# Patient Record
Sex: Female | Born: 1975 | Race: Asian | Hispanic: No | Marital: Married | Smoking: Never smoker
Health system: Southern US, Community
[De-identification: ages and names within clinical notes are randomized; demographics above are authoritative.]

## PROBLEM LIST (undated history)

## (undated) DIAGNOSIS — Z789 Other specified health status: Secondary | ICD-10-CM

---

## 1999-10-16 ENCOUNTER — Other Ambulatory Visit: Admission: RE | Admit: 1999-10-16 | Discharge: 1999-10-16 | Payer: Self-pay | Admitting: *Deleted

## 2000-01-06 ENCOUNTER — Encounter: Payer: Self-pay | Admitting: Obstetrics and Gynecology

## 2000-01-06 ENCOUNTER — Ambulatory Visit (HOSPITAL_COMMUNITY): Admission: RE | Admit: 2000-01-06 | Discharge: 2000-01-06 | Payer: Self-pay | Admitting: Obstetrics and Gynecology

## 2000-01-23 ENCOUNTER — Encounter: Payer: Self-pay | Admitting: Obstetrics and Gynecology

## 2000-01-23 ENCOUNTER — Inpatient Hospital Stay (HOSPITAL_COMMUNITY): Admission: AD | Admit: 2000-01-23 | Discharge: 2000-01-24 | Payer: Self-pay | Admitting: Obstetrics and Gynecology

## 2001-03-09 ENCOUNTER — Other Ambulatory Visit: Admission: RE | Admit: 2001-03-09 | Discharge: 2001-03-09 | Payer: Self-pay | Admitting: Obstetrics and Gynecology

## 2002-01-15 ENCOUNTER — Encounter: Payer: Self-pay | Admitting: *Deleted

## 2002-01-15 ENCOUNTER — Ambulatory Visit (HOSPITAL_COMMUNITY): Admission: RE | Admit: 2002-01-15 | Discharge: 2002-01-15 | Payer: Self-pay | Admitting: *Deleted

## 2003-10-11 ENCOUNTER — Other Ambulatory Visit: Admission: RE | Admit: 2003-10-11 | Discharge: 2003-10-11 | Payer: Self-pay | Admitting: Family Medicine

## 2006-04-08 ENCOUNTER — Ambulatory Visit: Payer: Self-pay | Admitting: Endocrinology

## 2006-04-08 LAB — CONVERTED CEMR LAB
Sed Rate: 21 mm/hr (ref 0–25)
TSH: 75.22 microintl units/mL — ABNORMAL HIGH (ref 0.35–5.50)

## 2006-05-19 ENCOUNTER — Ambulatory Visit: Payer: Self-pay | Admitting: Endocrinology

## 2006-05-19 LAB — CONVERTED CEMR LAB: TSH: 65.96 microintl units/mL — ABNORMAL HIGH (ref 0.35–5.50)

## 2006-09-15 ENCOUNTER — Encounter: Payer: Self-pay | Admitting: *Deleted

## 2006-09-15 DIAGNOSIS — E0590 Thyrotoxicosis, unspecified without thyrotoxic crisis or storm: Secondary | ICD-10-CM | POA: Insufficient documentation

## 2010-02-15 ENCOUNTER — Encounter: Payer: Self-pay | Admitting: Endocrinology

## 2014-03-31 ENCOUNTER — Emergency Department (HOSPITAL_COMMUNITY): Payer: 59

## 2014-03-31 ENCOUNTER — Emergency Department (HOSPITAL_COMMUNITY): Payer: 59 | Admitting: Anesthesiology

## 2014-03-31 ENCOUNTER — Encounter (HOSPITAL_COMMUNITY)
Admission: EM | Disposition: A | Discharge status: Home / Self Care | Payer: Self-pay | Source: Home / Self Care | Attending: Emergency Medicine

## 2014-03-31 ENCOUNTER — Encounter (HOSPITAL_COMMUNITY): Payer: Self-pay

## 2014-03-31 ENCOUNTER — Observation Stay (HOSPITAL_COMMUNITY)
Admission: EM | Admit: 2014-03-31 | Discharge: 2014-04-01 | Disposition: A | Discharge status: Home / Self Care | Payer: 59 | Attending: Surgery | Admitting: Surgery

## 2014-03-31 DIAGNOSIS — R1030 Lower abdominal pain, unspecified: Secondary | ICD-10-CM | POA: Diagnosis present

## 2014-03-31 DIAGNOSIS — E0590 Thyrotoxicosis, unspecified without thyrotoxic crisis or storm: Secondary | ICD-10-CM | POA: Diagnosis not present

## 2014-03-31 DIAGNOSIS — K3580 Unspecified acute appendicitis: Principal | ICD-10-CM | POA: Diagnosis present

## 2014-03-31 HISTORY — PX: LAPAROSCOPIC APPENDECTOMY: SUR753

## 2014-03-31 HISTORY — DX: Other specified health status: Z78.9

## 2014-03-31 HISTORY — PX: LAPAROSCOPIC APPENDECTOMY: SHX408

## 2014-03-31 LAB — COMPREHENSIVE METABOLIC PANEL
ALBUMIN: 4 g/dL (ref 3.5–5.2)
ALT: 25 U/L (ref 0–35)
AST: 31 U/L (ref 0–37)
Alkaline Phosphatase: 69 U/L (ref 39–117)
Anion gap: 6 (ref 5–15)
BILIRUBIN TOTAL: 0.6 mg/dL (ref 0.3–1.2)
BUN: 7 mg/dL (ref 6–23)
CALCIUM: 8.3 mg/dL — AB (ref 8.4–10.5)
CO2: 24 mmol/L (ref 19–32)
CREATININE: 0.68 mg/dL (ref 0.50–1.10)
Chloride: 106 mmol/L (ref 96–112)
GFR calc Af Amer: >90 mL/min (ref >90)
GFR calc non Af Amer: >90 mL/min (ref >90)
GLUCOSE: 103 mg/dL — AB (ref 70–99)
POTASSIUM: 3.5 mmol/L (ref 3.5–5.1)
SODIUM: 136 mmol/L (ref 135–145)
Total Protein: 7.3 g/dL (ref 6.0–8.3)

## 2014-03-31 LAB — CBC WITH DIFFERENTIAL/PLATELET
BASOS PCT: 0 % (ref 0–1)
Basophils Absolute: 0 10*3/uL (ref 0.0–0.1)
Eosinophils Absolute: 0.1 10*3/uL (ref 0.0–0.7)
Eosinophils Relative: 1 % (ref 0–5)
HCT: 28.7 % — ABNORMAL LOW (ref 36.0–46.0)
Hemoglobin: 9 g/dL — ABNORMAL LOW (ref 12.0–15.0)
LYMPHS ABS: 0.7 10*3/uL (ref 0.7–4.0)
LYMPHS PCT: 10 % — AB (ref 12–46)
MCH: 23.6 pg — ABNORMAL LOW (ref 26.0–34.0)
MCHC: 31.4 g/dL (ref 30.0–36.0)
MCV: 75.1 fL — ABNORMAL LOW (ref 78.0–100.0)
Monocytes Absolute: 0.3 10*3/uL (ref 0.1–1.0)
Monocytes Relative: 4 % (ref 3–12)
NEUTROS ABS: 6.2 10*3/uL (ref 1.7–7.7)
Neutrophils Relative %: 85 % — ABNORMAL HIGH (ref 43–77)
Platelets: 229 10*3/uL (ref 150–400)
RBC: 3.82 MIL/uL — AB (ref 3.87–5.11)
RDW: 15.9 % — ABNORMAL HIGH (ref 11.5–15.5)
WBC: 7.3 10*3/uL (ref 4.0–10.5)

## 2014-03-31 LAB — URINALYSIS, ROUTINE W REFLEX MICROSCOPIC
Bilirubin Urine: NEGATIVE
GLUCOSE, UA: NEGATIVE mg/dL
Ketones, ur: NEGATIVE mg/dL
Nitrite: NEGATIVE
PROTEIN: NEGATIVE mg/dL
Specific Gravity, Urine: 1.004 — ABNORMAL LOW (ref 1.005–1.030)
Urobilinogen, UA: 0.2 mg/dL (ref 0.0–1.0)
pH: 7 (ref 5.0–8.0)

## 2014-03-31 LAB — POC URINE PREG, ED: Preg Test, Ur: NEGATIVE

## 2014-03-31 LAB — URINE MICROSCOPIC-ADD ON

## 2014-03-31 SURGERY — APPENDECTOMY, LAPAROSCOPIC
Anesthesia: General

## 2014-03-31 MED ORDER — IOHEXOL 300 MG/ML  SOLN
100.0000 mL | Freq: Once | INTRAMUSCULAR | Status: AC | PRN
  Administered 2014-03-31: 100 mL via INTRAVENOUS

## 2014-03-31 MED ORDER — SODIUM CHLORIDE 0.9 % IV BOLUS (SEPSIS)
1000.0000 mL | Freq: Once | INTRAVENOUS | Status: AC
  Administered 2014-03-31: 1000 mL via INTRAVENOUS

## 2014-03-31 MED ORDER — MIDAZOLAM HCL 2 MG/2ML IJ SOLN
INTRAMUSCULAR | Status: AC
  Filled 2014-03-31: qty 2

## 2014-03-31 MED ORDER — BUPIVACAINE-EPINEPHRINE (PF) 0.25% -1:200000 IJ SOLN
INTRAMUSCULAR | Status: AC
  Filled 2014-03-31: qty 30

## 2014-03-31 MED ORDER — IOHEXOL 300 MG/ML  SOLN
25.0000 mL | Freq: Once | INTRAMUSCULAR | Status: AC | PRN
  Administered 2014-03-31: 25 mL via ORAL

## 2014-03-31 MED ORDER — ONDANSETRON HCL 4 MG PO TABS: 4.0000 mg | ORAL_TABLET | Freq: Four times a day (QID) | ORAL | Status: DC | PRN

## 2014-03-31 MED ORDER — SUCCINYLCHOLINE CHLORIDE 20 MG/ML IJ SOLN
INTRAMUSCULAR | Status: DC | PRN
  Administered 2014-03-31: 100 mg via INTRAVENOUS

## 2014-03-31 MED ORDER — SUCCINYLCHOLINE CHLORIDE 20 MG/ML IJ SOLN
INTRAMUSCULAR | Status: AC
  Filled 2014-03-31: qty 1

## 2014-03-31 MED ORDER — GLYCOPYRROLATE 0.2 MG/ML IJ SOLN
INTRAMUSCULAR | Status: AC
  Filled 2014-03-31: qty 2

## 2014-03-31 MED ORDER — DEXTROSE 5 % IV SOLN
2.0000 g | INTRAVENOUS | Status: DC
  Filled 2014-03-31: qty 2

## 2014-03-31 MED ORDER — ONDANSETRON HCL 4 MG/2ML IJ SOLN
INTRAMUSCULAR | Status: AC
  Filled 2014-03-31: qty 2

## 2014-03-31 MED ORDER — FENTANYL CITRATE 0.05 MG/ML IJ SOLN
INTRAMUSCULAR | Status: AC
  Filled 2014-03-31: qty 5

## 2014-03-31 MED ORDER — SODIUM CHLORIDE 0.9 % IR SOLN
Status: DC | PRN
  Administered 2014-03-31: 1000 mL

## 2014-03-31 MED ORDER — SCOPOLAMINE 1 MG/3DAYS TD PT72
MEDICATED_PATCH | TRANSDERMAL | Status: AC
  Administered 2014-03-31: 1 via TRANSDERMAL
  Filled 2014-03-31: qty 1

## 2014-03-31 MED ORDER — PROMETHAZINE HCL 25 MG/ML IJ SOLN
6.2500 mg | INTRAMUSCULAR | Status: DC | PRN
Start: 2014-03-31 — End: 2014-03-31

## 2014-03-31 MED ORDER — VECURONIUM BROMIDE 10 MG IV SOLR
INTRAVENOUS | Status: DC | PRN
  Administered 2014-03-31: 2 mg via INTRAVENOUS

## 2014-03-31 MED ORDER — HEMOSTATIC AGENTS (NO CHARGE) OPTIME
TOPICAL | Status: DC | PRN
  Administered 2014-03-31: 1 via TOPICAL

## 2014-03-31 MED ORDER — NEOSTIGMINE METHYLSULFATE 10 MG/10ML IV SOLN
INTRAVENOUS | Status: AC
  Filled 2014-03-31: qty 1

## 2014-03-31 MED ORDER — NEOSTIGMINE METHYLSULFATE 10 MG/10ML IV SOLN
INTRAVENOUS | Status: DC | PRN
  Administered 2014-03-31: 2 mg via INTRAVENOUS

## 2014-03-31 MED ORDER — DEXAMETHASONE SODIUM PHOSPHATE 4 MG/ML IJ SOLN
INTRAMUSCULAR | Status: AC
  Filled 2014-03-31: qty 1

## 2014-03-31 MED ORDER — FENTANYL CITRATE 0.05 MG/ML IJ SOLN
INTRAMUSCULAR | Status: DC | PRN
  Administered 2014-03-31: 100 ug via INTRAVENOUS
  Administered 2014-03-31: 50 ug via INTRAVENOUS

## 2014-03-31 MED ORDER — 0.9 % SODIUM CHLORIDE (POUR BTL) OPTIME
TOPICAL | Status: DC | PRN
  Administered 2014-03-31: 1000 mL

## 2014-03-31 MED ORDER — PROPOFOL 10 MG/ML IV BOLUS
INTRAVENOUS | Status: AC
  Filled 2014-03-31: qty 20

## 2014-03-31 MED ORDER — HYDROMORPHONE HCL 1 MG/ML IJ SOLN
1.0000 mg | INTRAMUSCULAR | Status: DC | PRN
  Administered 2014-03-31: 1 mg via INTRAVENOUS
  Filled 2014-03-31: qty 1

## 2014-03-31 MED ORDER — LACTATED RINGERS IV SOLN
INTRAVENOUS | Status: DC | PRN
  Administered 2014-03-31 (×3): via INTRAVENOUS

## 2014-03-31 MED ORDER — GLYCOPYRROLATE 0.2 MG/ML IJ SOLN
INTRAMUSCULAR | Status: DC | PRN
  Administered 2014-03-31: 0.4 mg via INTRAVENOUS

## 2014-03-31 MED ORDER — SODIUM CHLORIDE 0.9 % IJ SOLN
INTRAMUSCULAR | Status: AC
  Filled 2014-03-31: qty 10

## 2014-03-31 MED ORDER — ONDANSETRON HCL 4 MG/2ML IJ SOLN
INTRAMUSCULAR | Status: DC | PRN
  Administered 2014-03-31: 4 mg via INTRAVENOUS

## 2014-03-31 MED ORDER — KCL IN DEXTROSE-NACL 20-5-0.9 MEQ/L-%-% IV SOLN
INTRAVENOUS | Status: DC
  Administered 2014-03-31 – 2014-04-01 (×2): via INTRAVENOUS
  Filled 2014-03-31 (×2): qty 1000

## 2014-03-31 MED ORDER — DEXTROSE 5 % IV SOLN
INTRAVENOUS | Status: AC
  Administered 2014-03-31 (×2): 1 g via INTRAVENOUS
  Filled 2014-03-31 (×2): qty 1

## 2014-03-31 MED ORDER — MEPERIDINE HCL 25 MG/ML IJ SOLN: 6.2500 mg | INTRAMUSCULAR | Status: DC | PRN

## 2014-03-31 MED ORDER — ENOXAPARIN SODIUM 40 MG/0.4ML ~~LOC~~ SOLN
40.0000 mg | SUBCUTANEOUS | Status: DC
  Administered 2014-04-01: 40 mg via SUBCUTANEOUS
  Filled 2014-03-31: qty 0.4

## 2014-03-31 MED ORDER — MIDAZOLAM HCL 2 MG/2ML IJ SOLN: 0.5000 mg | Freq: Once | INTRAMUSCULAR | Status: DC | PRN

## 2014-03-31 MED ORDER — VECURONIUM BROMIDE 10 MG IV SOLR
INTRAVENOUS | Status: AC
  Filled 2014-03-31: qty 10

## 2014-03-31 MED ORDER — ONDANSETRON HCL 4 MG/2ML IJ SOLN
4.0000 mg | Freq: Once | INTRAMUSCULAR | Status: AC
  Administered 2014-03-31: 4 mg via INTRAVENOUS
  Filled 2014-03-31: qty 2

## 2014-03-31 MED ORDER — ONDANSETRON HCL 4 MG/2ML IJ SOLN: 4.0000 mg | Freq: Four times a day (QID) | INTRAMUSCULAR | Status: DC | PRN

## 2014-03-31 MED ORDER — BUPIVACAINE-EPINEPHRINE 0.25% -1:200000 IJ SOLN
INTRAMUSCULAR | Status: DC | PRN
  Administered 2014-03-31: 10 mL

## 2014-03-31 MED ORDER — DEXAMETHASONE SODIUM PHOSPHATE 4 MG/ML IJ SOLN
INTRAMUSCULAR | Status: DC | PRN
  Administered 2014-03-31: 4 mg via INTRAVENOUS

## 2014-03-31 MED ORDER — HYDROMORPHONE HCL 1 MG/ML IJ SOLN: 0.2500 mg | INTRAMUSCULAR | Status: DC | PRN

## 2014-03-31 MED ORDER — LIDOCAINE HCL (CARDIAC) 20 MG/ML IV SOLN
INTRAVENOUS | Status: AC
Start: 2014-03-31 — End: 2014-03-31
  Filled 2014-03-31: qty 5

## 2014-03-31 MED ORDER — MIDAZOLAM HCL 2 MG/2ML IJ SOLN
INTRAMUSCULAR | Status: DC | PRN
  Administered 2014-03-31: 2 mg via INTRAVENOUS

## 2014-03-31 MED ORDER — OXYCODONE-ACETAMINOPHEN 5-325 MG PO TABS
1.0000 | ORAL_TABLET | ORAL | Status: DC | PRN
  Administered 2014-03-31 – 2014-04-01 (×2): 2 via ORAL
  Filled 2014-03-31 (×2): qty 2

## 2014-03-31 SURGICAL SUPPLY — 41 items
APPLIER CLIP ROT 10 11.4 M/L (STAPLE)
APR CLP MED LRG 11.4X10 (STAPLE)
BAG SPEC RTRVL LRG 6X4 10 (ENDOMECHANICALS) ×1
BLADE SURG ROTATE 9660 (MISCELLANEOUS) IMPLANT
CANISTER SUCTION 2500CC (MISCELLANEOUS) ×2 IMPLANT
CHLORAPREP W/TINT 26ML (MISCELLANEOUS) ×2 IMPLANT
CLIP APPLIE ROT 10 11.4 M/L (STAPLE) IMPLANT
COVER SURGICAL LIGHT HANDLE (MISCELLANEOUS) ×2 IMPLANT
CUTTER FLEX LINEAR 45M (STAPLE) ×2 IMPLANT
DRAPE LAPAROSCOPIC ABDOMINAL (DRAPES) ×2 IMPLANT
DRAPE WARM FLUID 44X44 (DRAPE) ×2 IMPLANT
ELECT REM PT RETURN 9FT ADLT (ELECTROSURGICAL) ×2
ELECTRODE REM PT RTRN 9FT ADLT (ELECTROSURGICAL) ×1 IMPLANT
ENDOLOOP SUT PDS II  0 18 (SUTURE)
ENDOLOOP SUT PDS II 0 18 (SUTURE) IMPLANT
GLOVE BIO SURGEON STRL SZ8 (GLOVE) ×2 IMPLANT
GLOVE BIOGEL PI IND STRL 8 (GLOVE) ×1 IMPLANT
GLOVE BIOGEL PI INDICATOR 8 (GLOVE) ×1
GOWN STRL REUS W/ TWL LRG LVL3 (GOWN DISPOSABLE) ×2 IMPLANT
GOWN STRL REUS W/ TWL XL LVL3 (GOWN DISPOSABLE) ×1 IMPLANT
GOWN STRL REUS W/TWL LRG LVL3 (GOWN DISPOSABLE) ×4
GOWN STRL REUS W/TWL XL LVL3 (GOWN DISPOSABLE) ×2
KIT BASIN OR (CUSTOM PROCEDURE TRAY) ×2 IMPLANT
KIT ROOM TURNOVER OR (KITS) ×2 IMPLANT
LIQUID BAND (GAUZE/BANDAGES/DRESSINGS) ×2 IMPLANT
NS IRRIG 1000ML POUR BTL (IV SOLUTION) ×2 IMPLANT
PAD ARMBOARD 7.5X6 YLW CONV (MISCELLANEOUS) ×4 IMPLANT
POUCH SPECIMEN RETRIEVAL 10MM (ENDOMECHANICALS) ×2 IMPLANT
RELOAD STAPLE 45 3.5 BLU ETS (ENDOMECHANICALS) ×1 IMPLANT
RELOAD STAPLE TA45 3.5 REG BLU (ENDOMECHANICALS) ×2 IMPLANT
SCALPEL HARMONIC ACE (MISCELLANEOUS) ×2 IMPLANT
SET IRRIG TUBING LAPAROSCOPIC (IRRIGATION / IRRIGATOR) ×2 IMPLANT
SPECIMEN JAR SMALL (MISCELLANEOUS) ×2 IMPLANT
SUT MON AB 4-0 PC3 18 (SUTURE) ×2 IMPLANT
TOWEL OR 17X24 6PK STRL BLUE (TOWEL DISPOSABLE) ×2 IMPLANT
TOWEL OR 17X26 10 PK STRL BLUE (TOWEL DISPOSABLE) ×2 IMPLANT
TRAY FOLEY CATH 16FR SILVER (SET/KITS/TRAYS/PACK) ×2 IMPLANT
TRAY LAPAROSCOPIC (CUSTOM PROCEDURE TRAY) ×2 IMPLANT
TROCAR XCEL BLADELESS 5X75MML (TROCAR) ×4 IMPLANT
TROCAR XCEL BLUNT TIP 100MML (ENDOMECHANICALS) ×2 IMPLANT
TUBING INSUFFLATION (TUBING) ×2 IMPLANT

## 2014-03-31 NOTE — Transfer of Care (Signed)
Immediate Anesthesia Transfer of Care Note  Patient: Carly Green  Procedure(s) Performed: Procedure(s): APPENDECTOMY LAPAROSCOPIC (N/A)  Patient Location: PACU  Anesthesia Type:General  Level of Consciousness: sedated  Airway & Oxygen Therapy: Patient Spontanous Breathing and Patient connected to nasal cannula oxygen  Post-op Assessment: Report given to RN and Post -op Vital signs reviewed and stable  Post vital signs: Reviewed and stable  Last Vitals:  Filed Vitals:   03/31/14 1430  BP: 112/77  Pulse: 59  Temp:   Resp:     Complications: No apparent anesthesia complications

## 2014-03-31 NOTE — ED Provider Notes (Signed)
CSN: [id number]     Arrival date & time 03/31/14  0729 History   First MD Initiated Contact with Patient 03/31/14 0730     Chief Complaint  Patient presents with  . Abdominal Pain  . Emesis     (Consider location/radiation/quality/duration/timing/severity/associated sxs/prior Treatment) Patient is a 39 y.o. female presenting with abdominal pain.  Abdominal Pain Pain location:  Suprapubic Pain quality: sharp   Pain radiates to:  Does not radiate Pain severity:  Moderate Onset quality:  Gradual Duration:  2 days Timing:  Constant Progression:  Worsening Chronicity:  Recurrent Context comment:  Has pain monthly with menstrual period, currently on menstrual period Relieved by:  Nothing Worsened by:  Palpation Ineffective treatments:  None tried Associated symptoms: anorexia, chills, diarrhea and vomiting   Associated symptoms: no dysuria and no fever     Past Medical History  Diagnosis Date  . Medical history non-contributory    Past Surgical History  Procedure Laterality Date  . Laparoscopic appendectomy  03/31/14    Dr. Luisa Hartornett  . Laparoscopic appendectomy N/A 03/31/2014    Procedure: APPENDECTOMY LAPAROSCOPIC;  Surgeon: Harriette Bouillonhomas Cornett, MD;  [facility];  Service: General;  Laterality: N/A;   History reviewed. No pertinent family history. History  Substance Use Topics  . Smoking status: Never Smoker   . Smokeless tobacco: Never Used  . Alcohol Use: No   OB History    No data available     Review of Systems  Constitutional: Positive for chills. Negative for fever.  Gastrointestinal: Positive for vomiting, abdominal pain, diarrhea and anorexia.  Genitourinary: Negative for dysuria.  All other systems reviewed and are negative.     Allergies  Review of patient's allergies indicates no known allergies.  Home Medications   Prior to Admission medications   Medication Sig Start Date End Date Taking? Authorizing Provider  ibuprofen (ADVIL,MOTRIN) 200 MG tablet  Take 400 mg by mouth every 6 (six) hours as needed for mild pain.   Yes Historical Provider, MD  docusate sodium (COLACE) 100 MG capsule Take 1 capsule (100 mg total) by mouth 2 (two) times daily as needed for mild constipation or moderate constipation. 04/01/14   Megan N Dort, PA-C  oxyCODONE-acetaminophen (PERCOCET/ROXICET) 5-325 MG per tablet Take 1-2 tablets by mouth every 6 (six) hours as needed for moderate pain. 04/01/14   Megan N Dort, PA-C   BP 100/58 mmHg  Pulse 59  Temp(Src) 98.3 F (36.8 C) (Oral)  Resp 16  Ht 5' (1.524 m)  Wt 120 lb (54.432 kg)  BMI 23.44 kg/m2  SpO2 100%  LMP 03/30/2014 Physical Exam  Constitutional: She is oriented to person, place, and time. She appears well-developed and well-nourished.  HENT:  Head: Normocephalic and atraumatic.  Right Ear: External ear normal.  Left Ear: External ear normal.  Eyes: Conjunctivae and EOM are normal. Pupils are equal, round, and reactive to light.  Neck: Normal range of motion. Neck supple.  Cardiovascular: Normal rate, regular rhythm, normal heart sounds and intact distal pulses.   Pulmonary/Chest: Effort normal and breath sounds normal.  Abdominal: Soft. Bowel sounds are normal. There is tenderness in the suprapubic area.  Musculoskeletal: Normal range of motion.  Neurological: She is alert and oriented to person, place, and time.  Skin: Skin is warm and dry.  Vitals reviewed.   ED Course  Procedures (including critical care time) Labs Review Labs Reviewed  COMPREHENSIVE METABOLIC PANEL - Abnormal; Notable for the following:    Glucose, Bld 103 (*)  Calcium 8.3 (*)    All other components within normal limits  CBC WITH DIFFERENTIAL/PLATELET - Abnormal; Notable for the following:    RBC 3.82 (*)    Hemoglobin 9.0 (*)    HCT 28.7 (*)    MCV 75.1 (*)    MCH 23.6 (*)    RDW 15.9 (*)    Neutrophils Relative % 85 (*)    Lymphocytes Relative 10 (*)    All other components within normal limits  URINALYSIS,  ROUTINE W REFLEX MICROSCOPIC - Abnormal; Notable for the following:    Specific Gravity, Urine 1.004 (*)    Hgb urine dipstick LARGE (*)    Leukocytes, UA SMALL (*)    All other components within normal limits  CBC - Abnormal; Notable for the following:    Hemoglobin 9.2 (*)    HCT 29.4 (*)    MCV 74.6 (*)    MCH 23.4 (*)    RDW 16.0 (*)    All other components within normal limits  BASIC METABOLIC PANEL - Abnormal; Notable for the following:    Glucose, Bld 123 (*)    BUN <5 (*)    Calcium 8.0 (*)    Anion gap 4 (*)    All other components within normal limits  URINE MICROSCOPIC-ADD ON  POC URINE PREG, ED  SURGICAL PATHOLOGY    Imaging Review No results found.   EKG Interpretation None      MDM   Final diagnoses:  Lower abdominal pain    39 y.o. female without pertinent PMH presents with abd pain, n/v/d as above.  Exam as above.  CT scan with appendicitis.  Consulted surgery.  Pt admitted.   I have reviewed all laboratory and imaging studies if ordered as above  1. Lower abdominal pain         Noel Gerold, MD 04/03/14 1511

## 2014-03-31 NOTE — ED Notes (Signed)
Pt remains monitored by blood pressure and pulse ox.  

## 2014-03-31 NOTE — Anesthesia Postprocedure Evaluation (Signed)
  Anesthesia Post-op Note  Patient: Carly Green  Procedure(s) Performed: Procedure(s): APPENDECTOMY LAPAROSCOPIC (N/A)  Patient Location: PACU  Anesthesia Type:General  Level of Consciousness: awake, alert , oriented and patient cooperative  Airway and Oxygen Therapy: Patient Spontanous Breathing  Post-op Pain: none  Post-op Assessment: Post-op Vital signs reviewed, Patient's Cardiovascular Status Stable, Respiratory Function Stable, Patent Airway, No signs of Nausea or vomiting and Pain level controlled  Post-op Vital Signs: Reviewed and stable  Last Vitals:  Filed Vitals:   03/31/14 1901  BP: 124/59  Pulse: 57  Temp: 37 C  Resp: 14    Complications: No apparent anesthesia complications

## 2014-03-31 NOTE — Anesthesia Procedure Notes (Signed)
Procedure Name: Intubation Date/Time: 03/31/2014 5:08 PM Performed by: Alanda AmassFRIEDMAN, Johne Buckle A Pre-anesthesia Checklist: Patient identified, Timeout performed, Emergency Drugs available, Suction available and Patient being monitored Patient Re-evaluated:Patient Re-evaluated prior to inductionOxygen Delivery Method: Circle system utilized Preoxygenation: Pre-oxygenation with 100% oxygen Intubation Type: IV induction, Rapid sequence and Cricoid Pressure applied Ventilation: Mask ventilation without difficulty Laryngoscope Size: Mac and 3 Grade View: Grade I Tube type: Oral Tube size: 8.0 mm Number of attempts: 2 Airway Equipment and Method: Stylet Placement Confirmation: ETT inserted through vocal cords under direct vision,  breath sounds checked- equal and bilateral and positive ETCO2 Secured at: 20 cm Tube secured with: Tape Dental Injury: Teeth and Oropharynx as per pre-operative assessment

## 2014-03-31 NOTE — Op Note (Signed)
Appendectomy, Lap, Procedure Note  Indications: The patient presented with a history of right-sided abdominal pain. A CT revealed findings consistent with acute appendicitis.The procedure has been discussed with the patient.  Alternative therapies have been discussed with the patient.  Operative risks include bleeding,  Infection,  Organ injury,  Nerve injury,  Blood vessel injury,  DVT,  Pulmonary embolism,  Death,  And possible reoperation.  Medical management risks include worsening of present situation.  The success of the procedure is 50 -90 % at treating patients symptoms.  The patient understands and agrees to proceed.  Pre-operative Diagnosis: Acute appendicitis without mention of peritonitis  Post-operative Diagnosis: Same  Surgeon: Devlynn Knoff A.   Assistants: none   Anesthesia: General endotracheal anesthesia and Local anesthesia 0.25.% bupivacaine, with epinephrine  ASA Class: 2  Procedure Details  The patient was seen again in the Holding Room. The risks, benefits, complications, treatment options, and expected outcomes were discussed with the patient and/or family. The possibilities of reaction to medication, pulmonary aspiration, perforation of viscus, bleeding, recurrent infection, finding a normal appendix, the need for additional procedures, failure to diagnose a condition, and creating a complication requiring transfusion or operation were discussed. There was concurrence with the proposed plan and informed consent was obtained. The site of surgery was properly noted/marked. The patient was taken to Operating Room, identified as Carly Green and the procedure verified as Appendectomy. A Time Out was held and the above information confirmed.  The patient was placed in the supine position and general anesthesia was induced, along with placement of orogastric tube, Venodyne boots, and a Foley catheter. The abdomen was prepped and draped in a sterile fashion. A one centimeter  infraumbilical incision was made and the peritoneal cavity was accessed using the OPEN  technique. The pneumoperitoneum was then established to steady pressure of 12 mmHg. A 12 mm port was placed through the umbilical incision. Additional 5 mm cannulas then placed in the left lower quadrant of the abdomen and right upper quadrant under direct vision. A careful evaluation of the entire abdomen was carried out. The patient was placed in Trendelenburg and left lateral decubitus position. The small intestines were retracted in the cephalad and left lateral direction away from the pelvis and right lower quadrant. The patient was found to have an enlarged and inflamed appendix that was extending into the pelvis. There was no evidence of perforation.  The appendix was carefully dissected. A window was made in the mesoappendix at the base of the appendix. A harmonic scalpel was used across the mesoappendix. The appendix was divided at its base using an endo-GIA stapler. Minimal appendiceal stump was left in place. There was no evidence of bleeding, leakage, or complication after division of the appendix. Irrigation was also performed and irrigate suctioned from the abdomen as well. There appeared to be some oozing from the staple line that responded to Surgicel application with good hemostasis.  The appendix was removed with endocatch bag.   The umbilical port site was closed using 0 vicryl pursestring sutures fashion at the level of the fascia. The trocar site skin wounds were closed using skin 4 0 monocryl and liquid adhesive. .  Instrument, sponge, and needle counts were correct at the conclusion of the case.   Findings: The appendix was found to be inflamed. There were not signs of necrosis.  There was not perforation. There was not abscess formation.  Estimated Blood Loss:  less than 50 mL  Drains: none         Total IV Fluids: 600 mL         Specimens: appendix         Complications:  None;  patient tolerated the procedure well.         Disposition: PACU - hemodynamically stable.         Condition: stable

## 2014-03-31 NOTE — ED Notes (Signed)
[facility]- Pt coming from home with lower abdominal pain and weakness. Pt also reports vomiting. One episode of vomiting with EMS. Bradycardic on arrival at 52, and hypotensive at 90. 150cc of normal saline and 4mg  of zofran given en route.

## 2014-03-31 NOTE — H&P (Signed)
Carly Green is an 39 y.o. female.   Chief Complaint: lower abdominal pain and nausea HPI: 8 hour hx RLQ abdominal pain  Started at 5 am and has been sharp and worsening  midol and tylenol have not helped.  No diarrhea CT shows appendicitis.   No past medical history on file.  No past surgical history on file.  No family history on file. Social History:  reports that she has never smoked. She does not have any smokeless tobacco history on file. She reports that she does not drink alcohol or use illicit drugs.  Allergies: No Known Allergies   (Not in a hospital admission)  Results for orders placed or performed during the hospital encounter of 03/31/14 (from the past 48 hour(s))  Comprehensive metabolic panel     Status: Abnormal   Collection Time: 03/31/14  7:59 AM  Result Value Ref Range   Sodium 136 135 - 145 mmol/L   Potassium 3.5 3.5 - 5.1 mmol/L   Chloride 106 96 - 112 mmol/L   CO2 24 19 - 32 mmol/L   Glucose, Bld 103 (H) 70 - 99 mg/dL   BUN 7 6 - 23 mg/dL   Creatinine, Ser 0.68 0.50 - 1.10 mg/dL   Calcium 8.3 (L) 8.4 - 10.5 mg/dL   Total Protein 7.3 6.0 - 8.3 g/dL   Albumin 4.0 3.5 - 5.2 g/dL   AST 31 0 - 37 U/L   ALT 25 0 - 35 U/L   Alkaline Phosphatase 69 39 - 117 U/L   Total Bilirubin 0.6 0.3 - 1.2 mg/dL   GFR calc non Af Amer >90 >90 mL/min   GFR calc Af Amer >90 >90 mL/min    Comment: (NOTE) The eGFR has been calculated using the CKD EPI equation. This calculation has not been validated in all clinical situations. eGFR's persistently <90 mL/min signify possible Chronic Kidney Disease.    Anion gap 6 5 - 15  CBC with Differential     Status: Abnormal   Collection Time: 03/31/14  7:59 AM  Result Value Ref Range   WBC 7.3 4.0 - 10.5 K/uL   RBC 3.82 (L) 3.87 - 5.11 MIL/uL   Hemoglobin 9.0 (L) 12.0 - 15.0 g/dL   HCT 28.7 (L) 36.0 - 46.0 %   MCV 75.1 (L) 78.0 - 100.0 fL   MCH 23.6 (L) 26.0 - 34.0 pg   MCHC 31.4 30.0 - 36.0 g/dL   RDW 15.9 (H) 11.5 - 15.5 %   Platelets 229 150 - 400 K/uL   Neutrophils Relative % 85 (H) 43 - 77 %   Neutro Abs 6.2 1.7 - 7.7 K/uL   Lymphocytes Relative 10 (L) 12 - 46 %   Lymphs Abs 0.7 0.7 - 4.0 K/uL   Monocytes Relative 4 3 - 12 %   Monocytes Absolute 0.3 0.1 - 1.0 K/uL   Eosinophils Relative 1 0 - 5 %   Eosinophils Absolute 0.1 0.0 - 0.7 K/uL   Basophils Relative 0 0 - 1 %   Basophils Absolute 0.0 0.0 - 0.1 K/uL  Urinalysis, Routine w reflex microscopic     Status: Abnormal   Collection Time: 03/31/14 10:51 AM  Result Value Ref Range   Color, Urine YELLOW YELLOW   APPearance CLEAR CLEAR   Specific Gravity, Urine 1.004 (L) 1.005 - 1.030   pH 7.0 5.0 - 8.0   Glucose, UA NEGATIVE NEGATIVE mg/dL   Hgb urine dipstick LARGE (A) NEGATIVE   Bilirubin Urine NEGATIVE  NEGATIVE   Ketones, ur NEGATIVE NEGATIVE mg/dL   Protein, ur NEGATIVE NEGATIVE mg/dL   Urobilinogen, UA 0.2 0.0 - 1.0 mg/dL   Nitrite NEGATIVE NEGATIVE   Leukocytes, UA SMALL (A) NEGATIVE  Urine microscopic-add on     Status: None   Collection Time: 03/31/14 10:51 AM  Result Value Ref Range   Squamous Epithelial / LPF RARE RARE   RBC / HPF 0-2 <3 RBC/hpf  POC Urine Pregnancy, ED (do NOT order at [place])     Status: None   Collection Time: 03/31/14 11:06 AM  Result Value Ref Range   Preg Test, Ur NEGATIVE NEGATIVE    Comment:        THE SENSITIVITY OF THIS METHODOLOGY IS >24 mIU/mL    Ct Abdomen Pelvis W Contrast  03/31/2014   CLINICAL DATA:  Acute generalized abdominal and pelvic pain, fever, vomiting and weakness.  EXAM: CT ABDOMEN AND PELVIS WITH CONTRAST  TECHNIQUE: Multidetector CT imaging of the abdomen and pelvis was performed using the standard protocol following bolus administration of intravenous contrast.  CONTRAST:  165m OMNIPAQUE IOHEXOL 300 MG/ML  SOLN  COMPARISON:  01/15/2002 ultrasound  FINDINGS: Lower chest:  Unremarkable  Hepatobiliary: The liver and gallbladder are unremarkable. There is no evidence of biliary dilatation.   Pancreas: Unremarkable  Spleen: Unremarkable  Adrenals/Urinary Tract: Nonobstructing bilateral renal calculi are identified, 4 mm within the left lower pole and punctate within the mid right kidney. There is no evidence of hydronephrosis or obstructing urinary calculi.  There is no evidence of solid renal mass.  Adrenal glands are unremarkable.  Mild circumferential bladder wall thickening is nonspecific but may represent cystitis.  Stomach/Bowel: The appendix is mildly enlarged with mild adjacent inflammation, compatible with appendicitis. There is no evidence of abscess, pneumoperitoneum or bowel obstruction.  Vascular/Lymphatic: No enlarged lymph nodes or abdominal aortic aneurysm identified.  Reproductive: A septate uterus is noted. No other abnormalities identified.  Other: A trace amount of free pelvic fluid is noted and may be physiologic.  Musculoskeletal: No acute or suspicious bony abnormalities are identified.  IMPRESSION: Enlarged appendix with mild adjacent inflammation compatible with appendicitis. No complicating features.  Mild circumferential bladder wall thickening -nonspecific but cystitis is not excluded.   Electronically Signed   By: JMargarette CanadaM.D.   On: 03/31/2014 12:32    Review of Systems  Constitutional: Positive for chills.  HENT: Negative.   Eyes: Negative.   Respiratory: Negative.   Cardiovascular: Positive for palpitations.  Gastrointestinal: Positive for vomiting and abdominal pain. Negative for blood in stool.  Genitourinary: Negative.   Musculoskeletal: Negative.   Skin: Negative.   Psychiatric/Behavioral: The patient is nervous/anxious.     Blood pressure 112/77, pulse 59, temperature 97.7 F (36.5 C), temperature source Oral, resp. rate 18, last menstrual period 03/30/2014, SpO2 100 %. Physical Exam  Constitutional: She is oriented to person, place, and time. She appears well-developed and well-nourished.  HENT:  Head: Normocephalic and atraumatic.  Eyes:  Pupils are equal, round, and reactive to light. No scleral icterus.  Neck: Normal range of motion. Neck supple.  Cardiovascular: Normal rate.   Respiratory: Effort normal.  GI: There is tenderness in the right lower quadrant. There is tenderness at McBurney's point.  Musculoskeletal: Normal range of motion.  Neurological: She is alert and oriented to person, place, and time.  Skin: Skin is warm and dry.  Psychiatric: She has a normal mood and affect. Her behavior is normal. Judgment and thought content normal.  Assessment/Plan Acute appendicitis Discussed operative and non operative treatments and the pro and cons of each.  Risk of surgery includes bleeding,  Infection,  Bowel injury,  Organ injury,  DVT,  Death,  CV complication,  Abscess and need for open or other surgery.  Medical management discussed.  She wishes to proceed with surgery.  Discussed with family and niece.  Zailen Albarran A. 03/31/2014, 2:36 PM

## 2014-03-31 NOTE — Anesthesia Preprocedure Evaluation (Addendum)
Anesthesia Evaluation  Patient identified by MRN, date of birth, ID band Patient awake    Reviewed: Allergy & Precautions, NPO status , Patient's Chart, lab work & pertinent test results  History of Anesthesia Complications Negative for: history of anesthetic complications  Airway Mallampati: II  TM Distance: >3 FB Neck ROM: Full    Dental  (+) Dental Advisory Given, Teeth Intact, Missing   Pulmonary neg pulmonary ROS,  breath sounds clear to auscultation        Cardiovascular negative cardio ROS  Rhythm:Regular Rate:Normal     Neuro/Psych negative neurological ROS     GI/Hepatic Neg liver ROS, Appendicitis.  n/v   Endo/Other  negative endocrine ROS  Renal/GU negative Renal ROS     Musculoskeletal   Abdominal   Peds  Hematology  (+) Blood dyscrasia (Hb 9.0), ,   Anesthesia Other Findings   Reproductive/Obstetrics 03/31/14 Preg test: NEG                           Anesthesia Physical Anesthesia Plan  ASA: II  Anesthesia Plan: General   Post-op Pain Management:    Induction: Intravenous and Rapid sequence  Airway Management Planned: Oral ETT  Additional Equipment:   Intra-op Plan:   Post-operative Plan: Extubation in OR  Informed Consent: I have reviewed the patients History and Physical, chart, labs and discussed the procedure including the risks, benefits and alternatives for the proposed anesthesia with the patient or authorized representative who has indicated his/her understanding and acceptance.   Dental advisory given  Plan Discussed with: CRNA and Surgeon  Anesthesia Plan Comments: (Plan routine monitors, GETA)        Anesthesia Quick Evaluation

## 2014-04-01 ENCOUNTER — Encounter (HOSPITAL_COMMUNITY): Payer: Self-pay | Admitting: General Surgery

## 2014-04-01 LAB — CBC
HEMATOCRIT: 29.4 % — AB (ref 36.0–46.0)
Hemoglobin: 9.2 g/dL — ABNORMAL LOW (ref 12.0–15.0)
MCH: 23.4 pg — ABNORMAL LOW (ref 26.0–34.0)
MCHC: 31.3 g/dL (ref 30.0–36.0)
MCV: 74.6 fL — AB (ref 78.0–100.0)
PLATELETS: 264 10*3/uL (ref 150–400)
RBC: 3.94 MIL/uL (ref 3.87–5.11)
RDW: 16 % — AB (ref 11.5–15.5)
WBC: 6.2 10*3/uL (ref 4.0–10.5)

## 2014-04-01 LAB — BASIC METABOLIC PANEL
Anion gap: 4 — ABNORMAL LOW (ref 5–15)
BUN: <5 mg/dL — ABNORMAL LOW (ref 6–23)
CHLORIDE: 109 mmol/L (ref 96–112)
CO2: 23 mmol/L (ref 19–32)
Calcium: 8 mg/dL — ABNORMAL LOW (ref 8.4–10.5)
Creatinine, Ser: 0.79 mg/dL (ref 0.50–1.10)
GFR calc Af Amer: >90 mL/min (ref >90)
GFR calc non Af Amer: >90 mL/min (ref >90)
GLUCOSE: 123 mg/dL — AB (ref 70–99)
POTASSIUM: 3.9 mmol/L (ref 3.5–5.1)
Sodium: 136 mmol/L (ref 135–145)

## 2014-04-01 MED ORDER — DOCUSATE SODIUM 100 MG PO CAPS: 100.0000 mg | ORAL_CAPSULE | Freq: Two times a day (BID) | ORAL | Status: AC | PRN

## 2014-04-01 MED ORDER — OXYCODONE-ACETAMINOPHEN 5-325 MG PO TABS: 1.0000 | ORAL_TABLET | Freq: Four times a day (QID) | ORAL | Status: AC | PRN

## 2014-04-01 NOTE — Progress Notes (Signed)
Sheilyn N Nucci to be D/C'd Home per MD order.  Discussed with the patient and all questions fully answered.  VSS, Surgical incision sites clean, dry, intact with no sign of infection.  IV catheter discontinued intact. Site without signs and symptoms of complications. Dressing and pressure applied.  An After Visit Summary was printed and given to the patient. Patient received prescription.  D/c education completed with patient/family including follow up instructions, medication list, d/c activities limitations if indicated, with other d/c instructions as indicated by MD - patient able to verbalize understanding, all questions fully answered.   Patient instructed to return to ED, call 911, or call MD for any changes in condition.   Patient escorted via WC, and D/C home via private auto.  Burt EkCook, Rosebud Koenen D 04/01/2014 10:21 AM

## 2014-04-01 NOTE — Discharge Instructions (Signed)

## 2014-04-01 NOTE — Discharge Summary (Signed)
[facility] Discharge Summary   Patient ID: Carly HamHue N Green MRN: [id number] DOB/AGE: 1975-07-15 39 y.o.  Admit date: 03/31/2014 Discharge date: 04/01/2014  Admitting Diagnosis: Acute appendicitis  Discharge Diagnosis Patient Active Problem List   Diagnosis Date Noted  . Acute appendicitis 03/31/2014  . HYPERTHYROIDISM 09/15/2006    Consultants None  Imaging: Ct Abdomen Pelvis W Contrast  03/31/2014   CLINICAL DATA:  Acute generalized abdominal and pelvic pain, fever, vomiting and weakness.  EXAM: CT ABDOMEN AND PELVIS WITH CONTRAST  TECHNIQUE: Multidetector CT imaging of the abdomen and pelvis was performed using the standard protocol following bolus administration of intravenous contrast.  CONTRAST:  100mL OMNIPAQUE IOHEXOL 300 MG/ML  SOLN  COMPARISON:  01/15/2002 ultrasound  FINDINGS: Lower chest:  Unremarkable  Hepatobiliary: The liver and gallbladder are unremarkable. There is no evidence of biliary dilatation.  Pancreas: Unremarkable  Spleen: Unremarkable  Adrenals/Urinary Tract: Nonobstructing bilateral renal calculi are identified, 4 mm within the left lower pole and punctate within the mid right kidney. There is no evidence of hydronephrosis or obstructing urinary calculi.  There is no evidence of solid renal mass.  Adrenal glands are unremarkable.  Mild circumferential bladder wall thickening is nonspecific but may represent cystitis.  Stomach/Bowel: The appendix is mildly enlarged with mild adjacent inflammation, compatible with appendicitis. There is no evidence of abscess, pneumoperitoneum or bowel obstruction.  Vascular/Lymphatic: No enlarged lymph nodes or abdominal aortic aneurysm identified.  Reproductive: A septate uterus is noted. No other abnormalities identified.  Other: A trace amount of free pelvic fluid is noted and may be physiologic.  Musculoskeletal: No acute or suspicious bony abnormalities are identified.  IMPRESSION: Enlarged appendix with mild adjacent  inflammation compatible with appendicitis. No complicating features.  Mild circumferential bladder wall thickening -nonspecific but cystitis is not excluded.   Electronically Signed   By: Harmon PierJeffrey  Hu M.D.   On: 03/31/2014 12:32    Procedures Dr. Luisa Hartornett (03/31/14) - Laparoscopic Appendectomy   Hospital Course:  39 y/o Asian [facility] hour hx RLQ abdominal pain Started at 5 am and has been sharp and worsening midol and tylenol have not helped. No diarrhea CT shows appendicitis.  Patient was admitted and underwent procedure listed above.  Tolerated procedure well and was transferred to the floor.  Diet was advanced as tolerated.  On POD #1, the patient was voiding well, tolerating diet, ambulating well, pain well controlled, vital signs stable, incisions c/d/i and felt stable for discharge home.  Patient will follow up in our office in 3 weeks and knows to call with questions or concerns.  She is recommended to take a stool softener  Physical Exam: General:  Alert, NAD, pleasant, comfortable Abd:  Soft, ND, mild tenderness, incisions C/D/I     Medication List    STOP taking these medications        acetaminophen 325 MG tablet  Commonly known as:  TYLENOL      TAKE these medications        docusate sodium 100 MG capsule  Commonly known as:  COLACE  Take 1 capsule (100 mg total) by mouth 2 (two) times daily as needed for mild constipation or moderate constipation.     ibuprofen 200 MG tablet  Commonly known as:  ADVIL,MOTRIN  Take 400 mg by mouth every 6 (six) hours as needed for mild pain.     oxyCODONE-acetaminophen 5-325 MG per tablet  Commonly known as:  PERCOCET/ROXICET  Take 1-2 tablets by mouth  every 6 (six) hours as needed for moderate pain.         Follow-up Information    Follow up with [facility] [place] On 04/16/2014.   Why:  For post-operation check.  Your appointment is at 3:30pm, please arrive at least 30 min before your appointment to  complete your check in paperwork.  If you are unable to arrive 30 min prior to your appointment time we may have to cancel or reschedule yo   Contact information:   [address] [place] [address] [phone]      Signed: Aris Georgia, [facility] [phone]  04/01/2014, 8:45 AM

## 2015-11-13 IMAGING — CT CT ABD-PELV W/ CM
2 of 5 series · 11 of 46 positions shown, 12 images · IV contrast (Iodine)
Comparison: 01/15/2002 ultrasound

CLINICAL DATA: Acute generalized abdominal and pelvic pain, fever,
vomiting and weakness.

EXAM:
CT ABDOMEN AND PELVIS WITH CONTRAST
TECHNIQUE: Multidetector CT imaging of the abdomen and pelvis was performed
using the standard protocol following bolus administration of
intravenous contrast.
CONTRAST:  100mL OMNIPAQUE IOHEXOL 300 MG/ML  SOLN

[Series 201: routine, idose (2) · axial · 0.67mm/px · z∈[-450,-90]mm · 8 of 92 slices shown, 9 images]
[im 10/92  soft-tissue]
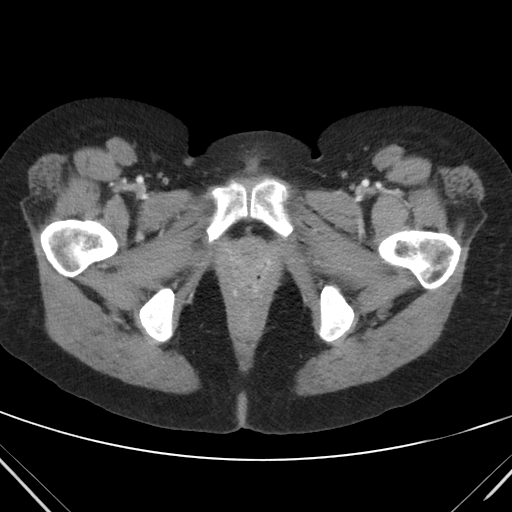
[im 10/92  bone]
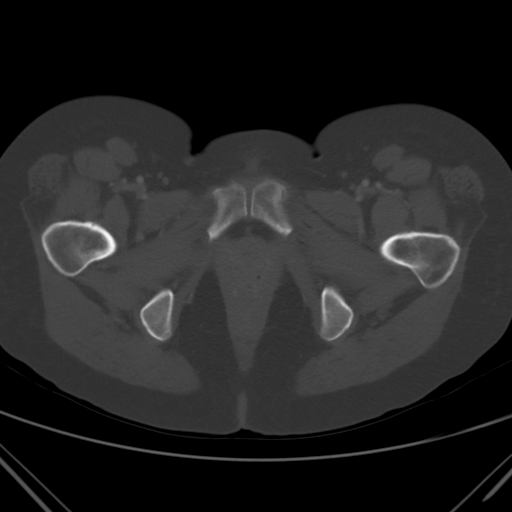
[im 19/92  soft-tissue]
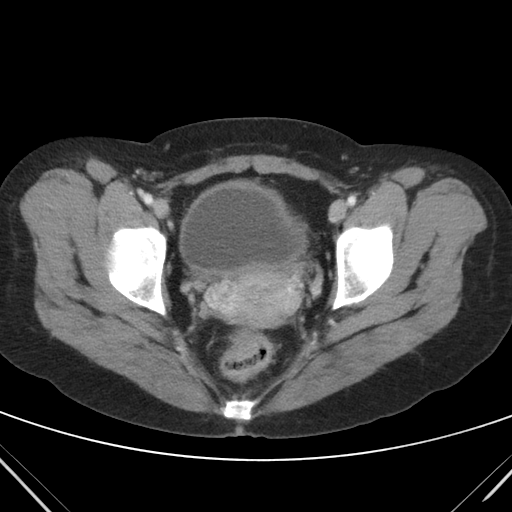
[im 28/92  soft-tissue]
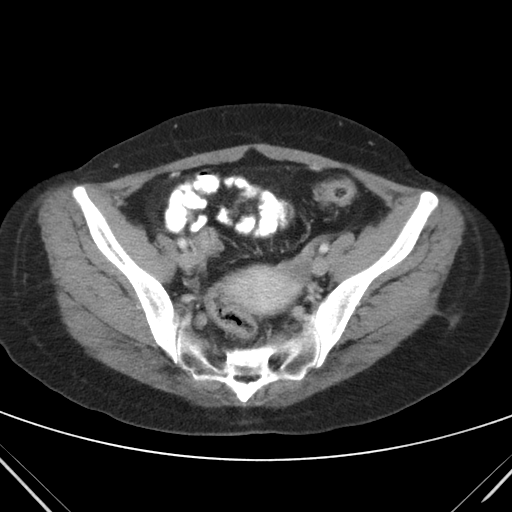
[im 41/92  soft-tissue]
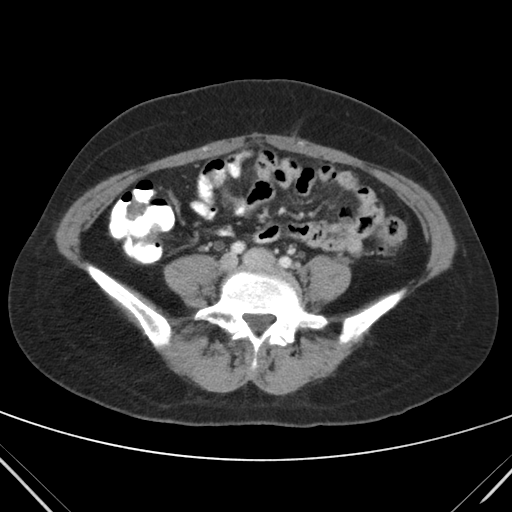
[im 51/92  soft-tissue]
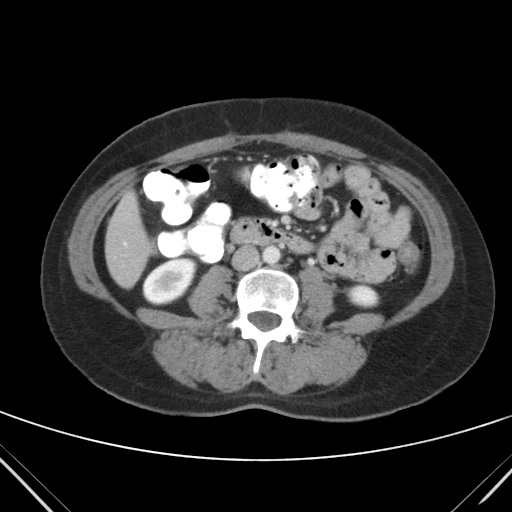
[im 64/92  soft-tissue]
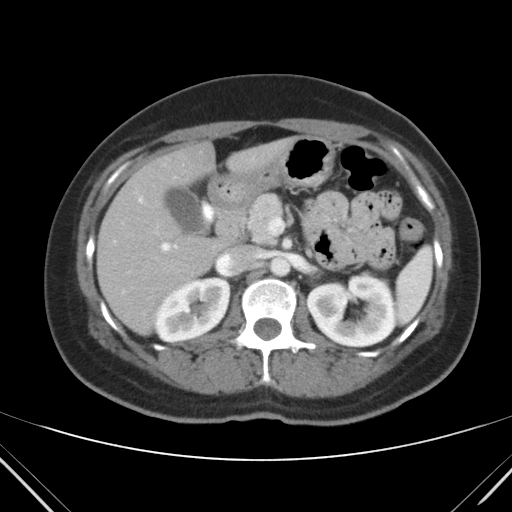
[im 73/92  soft-tissue]
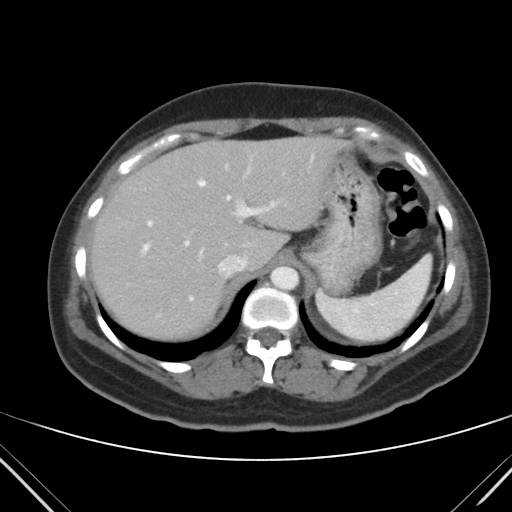
[im 82/92  soft-tissue]
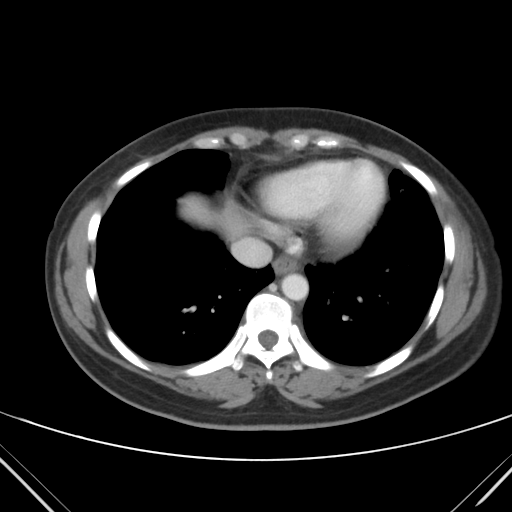

[Series 202: coronals, idose (2) · coronal · 0.45mm/px · 3 of 92 slices shown]
[im 31/92  soft-tissue]
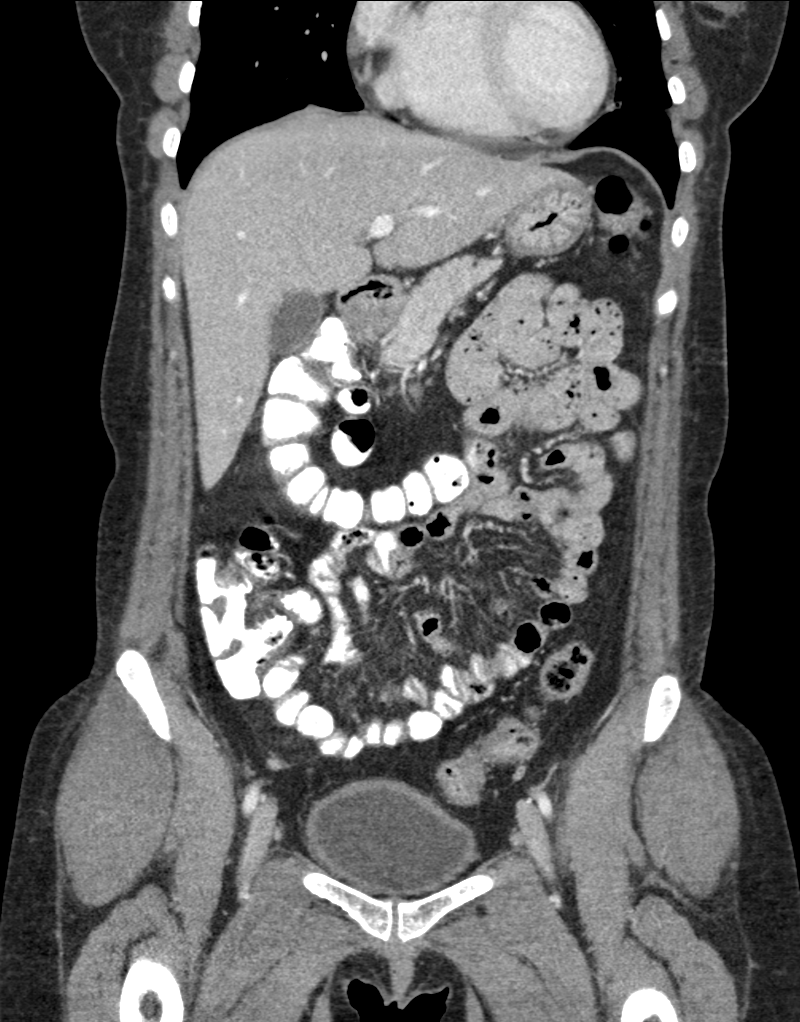
[im 41/92  soft-tissue]
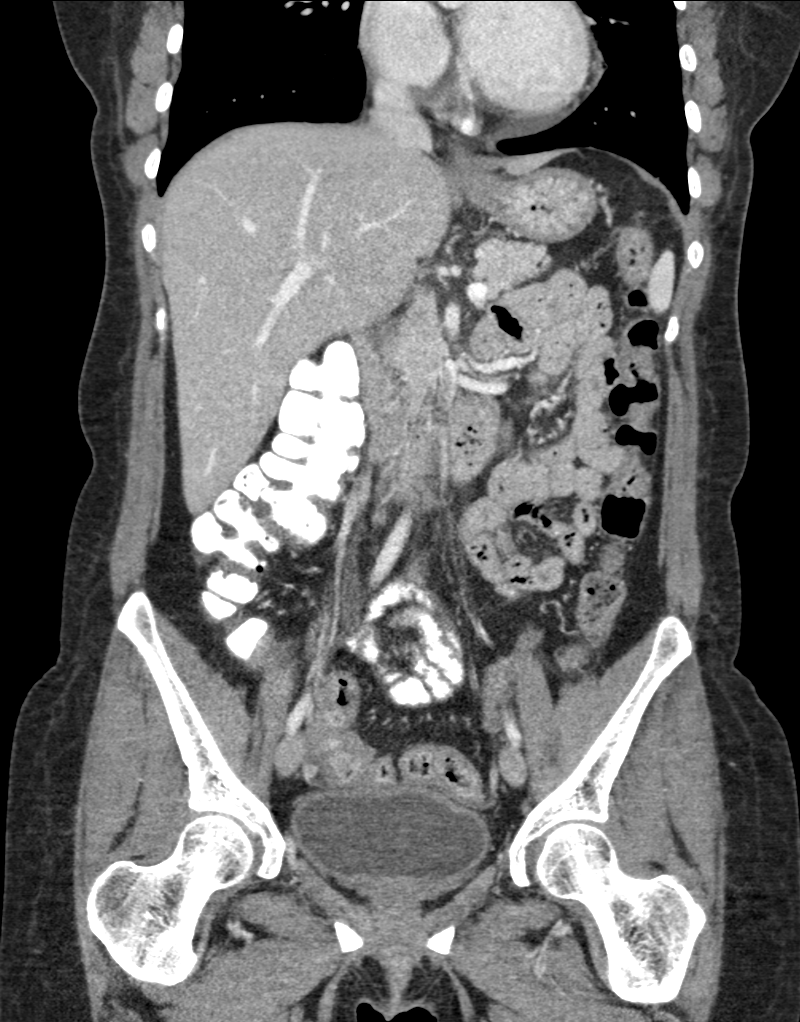
[im 51/92  soft-tissue]
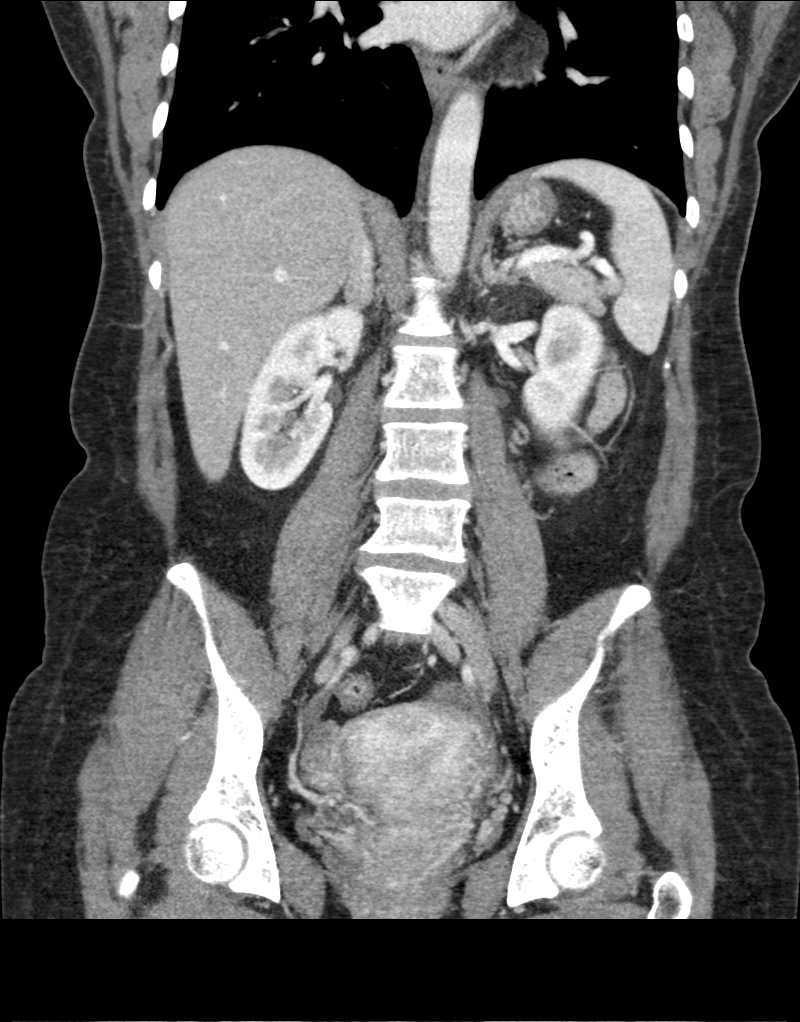

[11 of 46 positions shown; findings below may reference images not displayed]

FINDINGS: Lower chest:  Unremarkable

Hepatobiliary: The liver and gallbladder are unremarkable. There is
no evidence of biliary dilatation.

Pancreas: Unremarkable

Spleen: Unremarkable

Adrenals/Urinary Tract: Nonobstructing bilateral renal calculi are
identified, 4 mm within the left lower pole and punctate within the
mid right kidney. There is no evidence of hydronephrosis or
obstructing urinary calculi.

There is no evidence of solid renal mass.

Adrenal glands are unremarkable.

Mild circumferential bladder wall thickening is nonspecific but may
represent cystitis.

Stomach/Bowel: The appendix is mildly enlarged with mild adjacent
inflammation, compatible with appendicitis. There is no evidence of
abscess, pneumoperitoneum or bowel obstruction.

Vascular/Lymphatic: No enlarged lymph nodes or abdominal aortic
aneurysm identified.

Reproductive: A septate uterus is noted. No other abnormalities
identified.

Other: A trace amount of free pelvic fluid is noted and may be
physiologic.

Musculoskeletal: No acute or suspicious bony abnormalities are
identified.
IMPRESSION: Enlarged appendix with mild adjacent inflammation compatible with
appendicitis. No complicating features.

Mild circumferential bladder wall thickening -nonspecific but
cystitis is not excluded.

## 2016-05-13 ENCOUNTER — Other Ambulatory Visit (HOSPITAL_COMMUNITY)
Admission: RE | Admit: 2016-05-13 | Discharge: 2016-05-13 | Disposition: A | Discharge status: Home / Self Care | Payer: BLUE CROSS/BLUE SHIELD | Source: Ambulatory Visit | Attending: Physician Assistant | Admitting: Physician Assistant

## 2016-05-13 ENCOUNTER — Other Ambulatory Visit: Payer: Self-pay | Admitting: Physician Assistant

## 2016-05-13 DIAGNOSIS — Z0000 Encounter for general adult medical examination without abnormal findings: Secondary | ICD-10-CM | POA: Insufficient documentation

## 2016-05-13 DIAGNOSIS — Z1151 Encounter for screening for human papillomavirus (HPV): Secondary | ICD-10-CM | POA: Diagnosis present

## 2016-05-14 ENCOUNTER — Other Ambulatory Visit: Payer: Self-pay | Admitting: Family Medicine

## 2016-05-14 DIAGNOSIS — Z1231 Encounter for screening mammogram for malignant neoplasm of breast: Secondary | ICD-10-CM

## 2016-05-14 LAB — CYTOLOGY - PAP
Diagnosis: NEGATIVE
HPV: NOT DETECTED

## 2016-06-07 ENCOUNTER — Ambulatory Visit
Admission: RE | Admit: 2016-06-07 | Discharge: 2016-06-07 | Disposition: A | Discharge status: Home / Self Care | Payer: BLUE CROSS/BLUE SHIELD | Source: Ambulatory Visit | Attending: Family Medicine | Admitting: Family Medicine

## 2016-06-07 DIAGNOSIS — Z1231 Encounter for screening mammogram for malignant neoplasm of breast: Secondary | ICD-10-CM

## 2019-07-20 ENCOUNTER — Other Ambulatory Visit: Payer: Self-pay

## 2019-07-20 ENCOUNTER — Encounter (HOSPITAL_COMMUNITY): Payer: Self-pay

## 2019-07-20 ENCOUNTER — Emergency Department (HOSPITAL_COMMUNITY): Payer: BC Managed Care – PPO

## 2019-07-20 DIAGNOSIS — R0789 Other chest pain: Secondary | ICD-10-CM | POA: Insufficient documentation

## 2019-07-20 DIAGNOSIS — R0781 Pleurodynia: Secondary | ICD-10-CM | POA: Diagnosis present

## 2019-07-20 NOTE — ED Triage Notes (Signed)
Pt states right sided rib pain for 2 weeks. No known injury. Unable to get appt with pcp

## 2019-07-21 ENCOUNTER — Emergency Department (HOSPITAL_COMMUNITY)
Admission: EM | Admit: 2019-07-21 | Discharge: 2019-07-21 | Disposition: A | Discharge status: Home / Self Care | Payer: BC Managed Care – PPO | Attending: Emergency Medicine | Admitting: Emergency Medicine

## 2019-07-21 DIAGNOSIS — R0781 Pleurodynia: Secondary | ICD-10-CM

## 2019-07-21 MED ORDER — KETOROLAC TROMETHAMINE 60 MG/2ML IM SOLN
30.0000 mg | Freq: Once | INTRAMUSCULAR | Status: AC
  Administered 2019-07-21: 30 mg via INTRAMUSCULAR
  Filled 2019-07-21: qty 2

## 2019-07-21 NOTE — ED Notes (Signed)
PT declined vitals recheck

## 2019-07-21 NOTE — ED Provider Notes (Signed)
[facility] DEPT Provider Note  CSN: [id number] Arrival date & time: 07/20/19 2157  Chief Complaint(s) Right Rib Pain  HPI Carly Green is a 44 y.o. female   HPI CC: Right-sided rib pain  Onset/Duration: 2 weeks,  Timing: Intermittent Quality: Aching/stabbing at times Severity: Moderate to severe Modifying Factors:  Improved by: Nothing  Worsened by: Palpation, lying on her right side Associated Signs/Symptoms:  Pertinent (+): None  Pertinent (-): Fevers, chills, cough, congestion, chest pain, shortness of breath, trauma Context:   Past Medical History Past Medical History:  Diagnosis Date  . Medical history non-contributory    Patient Active Problem List   Diagnosis Date Noted  . Acute appendicitis 03/31/2014  . HYPERTHYROIDISM 09/15/2006   Home Medication(s) Prior to Admission medications   Medication Sig Start Date End Date Taking? Authorizing Provider  docusate sodium (COLACE) 100 MG capsule Take 1 capsule (100 mg total) by mouth 2 (two) times daily as needed for mild constipation or moderate constipation. 04/01/14   Nat Christen, PA-C  ibuprofen (ADVIL,MOTRIN) 200 MG tablet Take 400 mg by mouth every 6 (six) hours as needed for mild pain.    [provider]  oxyCODONE-acetaminophen (PERCOCET/ROXICET) 5-325 MG per tablet Take 1-2 tablets by mouth every 6 (six) hours as needed for moderate pain. 04/01/14   Nat Christen, PA-C                                                                                                                                    Past Surgical History Past Surgical History:  Procedure Laterality Date  . LAPAROSCOPIC APPENDECTOMY  03/31/14   Dr. Brantley Stage  . LAPAROSCOPIC APPENDECTOMY N/A 03/31/2014   Procedure: APPENDECTOMY LAPAROSCOPIC;  Surgeon: Erroll Luna, MD;  Location: [facility];  Service: General;  Laterality: N/A;   Family History No family history on file.  Social History Social History    Tobacco Use  . Smoking status: Never Smoker  . Smokeless tobacco: Never Used  Substance Use Topics  . Alcohol use: No  . Drug use: No   Allergies Patient has no known allergies.  Review of Systems Review of Systems All other systems are reviewed and are negative for acute change except as noted in the HPI  Physical Exam Vital Signs  I have reviewed the triage vital signs BP (!) 143/86   Pulse 80   Temp 98.9 F (37.2 C)   Resp 16   Ht 5' (1.524 m)   Wt 59 kg   SpO2 99%   BMI 25.39 kg/m   Physical Exam Vitals reviewed.  Constitutional:      General: She is not in acute distress.    Appearance: She is well-developed. She is not diaphoretic.  HENT:     Head: Normocephalic and atraumatic.     Right Ear: External ear normal.     Left Ear: External ear normal.  Nose: Nose normal.  Eyes:     General: No scleral icterus.    Conjunctiva/sclera: Conjunctivae normal.  Neck:     Trachea: Phonation normal.  Cardiovascular:     Rate and Rhythm: Normal rate and regular rhythm.  Pulmonary:     Effort: Pulmonary effort is normal. No respiratory distress.     Breath sounds: No stridor.  Chest:     Chest wall: Tenderness present.    Abdominal:     General: There is no distension.  Musculoskeletal:        General: Normal range of motion.     Cervical back: Normal range of motion.  Skin:    Findings: No rash.  Neurological:     Mental Status: She is alert and oriented to person, place, and time.  Psychiatric:        Behavior: Behavior normal.     ED Results and Treatments Labs (all labs ordered are listed, but only abnormal results are displayed) Labs Reviewed - No data to display                                                                                                                       EKG  EKG Interpretation  Date/Time:    Ventricular Rate:    PR Interval:    QRS Duration:   QT Interval:    QTC Calculation:   R Axis:     Text  Interpretation:        Radiology DG Ribs Unilateral W/Chest Right  Result Date: 07/20/2019 CLINICAL DATA:  44 year old female with right-sided chest pain. EXAM: RIGHT RIBS AND CHEST - 3+ VIEW COMPARISON:  None. FINDINGS: The lungs are clear. There is no pleural effusion pneumothorax. The cardiac silhouette is within limits. No acute osseous pathology. No displaced rib fractures. IMPRESSION: Negative. Electronically Signed   By: Elgie Collard M.D.   On: 07/20/2019 23:26    Pertinent labs & imaging results that were available during my care of the patient were reviewed by me and considered in my medical decision making (see chart for details).  Medications Ordered in ED Medications  ketorolac (TORADOL) injection 30 mg (30 mg Intramuscular Given 07/21/19 0313)                                                                                                                                    Procedures Procedures  (including critical care  time)  Medical Decision Making / ED Course I have reviewed the nursing notes for this encounter and the patient's prior records (if available in EHR or on provided paperwork).   Carly Green was evaluated in Emergency Department on 07/21/2019 for the symptoms described in the history of present illness. She was evaluated in the context of the global COVID-19 pandemic, which necessitated consideration that the patient might be at risk for infection with the SARS-CoV-2 virus that causes COVID-19. Institutional protocols and algorithms that pertain to the evaluation of patients at risk for COVID-19 are in a state of rapid change based on information released by regulatory bodies including the CDC and federal and state organizations. These policies and algorithms were followed during the patient's care in the ED.  Plain film negative for any fracture, pneumonia, pneumothorax. Appears to be chest wall related, likely muscle. Short course of NSAIDs  recommended.      Final Clinical Impression(s) / ED Diagnoses Final diagnoses:  Rib pain on right side   The patient appears reasonably screened and/or stabilized for discharge and I doubt any other medical condition or other [facility] requiring further screening, evaluation, or treatment in the ED at this time prior to discharge. Safe for discharge with strict return precautions.  Disposition: Discharge  Condition: Good  I have discussed the results, Dx and Tx plan with the patient/family who expressed understanding and agree(s) with the plan. Discharge instructions discussed at length. The patient/family was given strict return precautions who verbalized understanding of the instructions. No further questions at time of discharge.    ED Discharge Orders    None        Follow Up: Laurann Montana, MD [phone] [place] [address] [phone]  Schedule an appointment as soon as possible for a visit        This chart was dictated using voice recognition software.  Despite best efforts to proofread,  errors can occur which can change the documentation meaning.   Nira Conn, MD 07/21/19 [phone]

## 2019-07-21 NOTE — Discharge Instructions (Addendum)
You may use over-the-counter Motrin (Ibuprofen), Acetaminophen (Tylenol), topical muscle creams such as SalonPas, Icy Hot, Bengay, etc. Please stretch, apply heat, and have massage therapy for additional assistance. ° °

## 2021-03-03 IMAGING — CR DG RIBS W/ CHEST 3+V*R*
3 series · 3 of 3 positions shown · non-contrast
Comparison: None.

CLINICAL DATA: 43-year-old female with right-sided chest pain.

EXAM:
RIGHT RIBS AND CHEST - 3+ VIEW

[w chest pa]
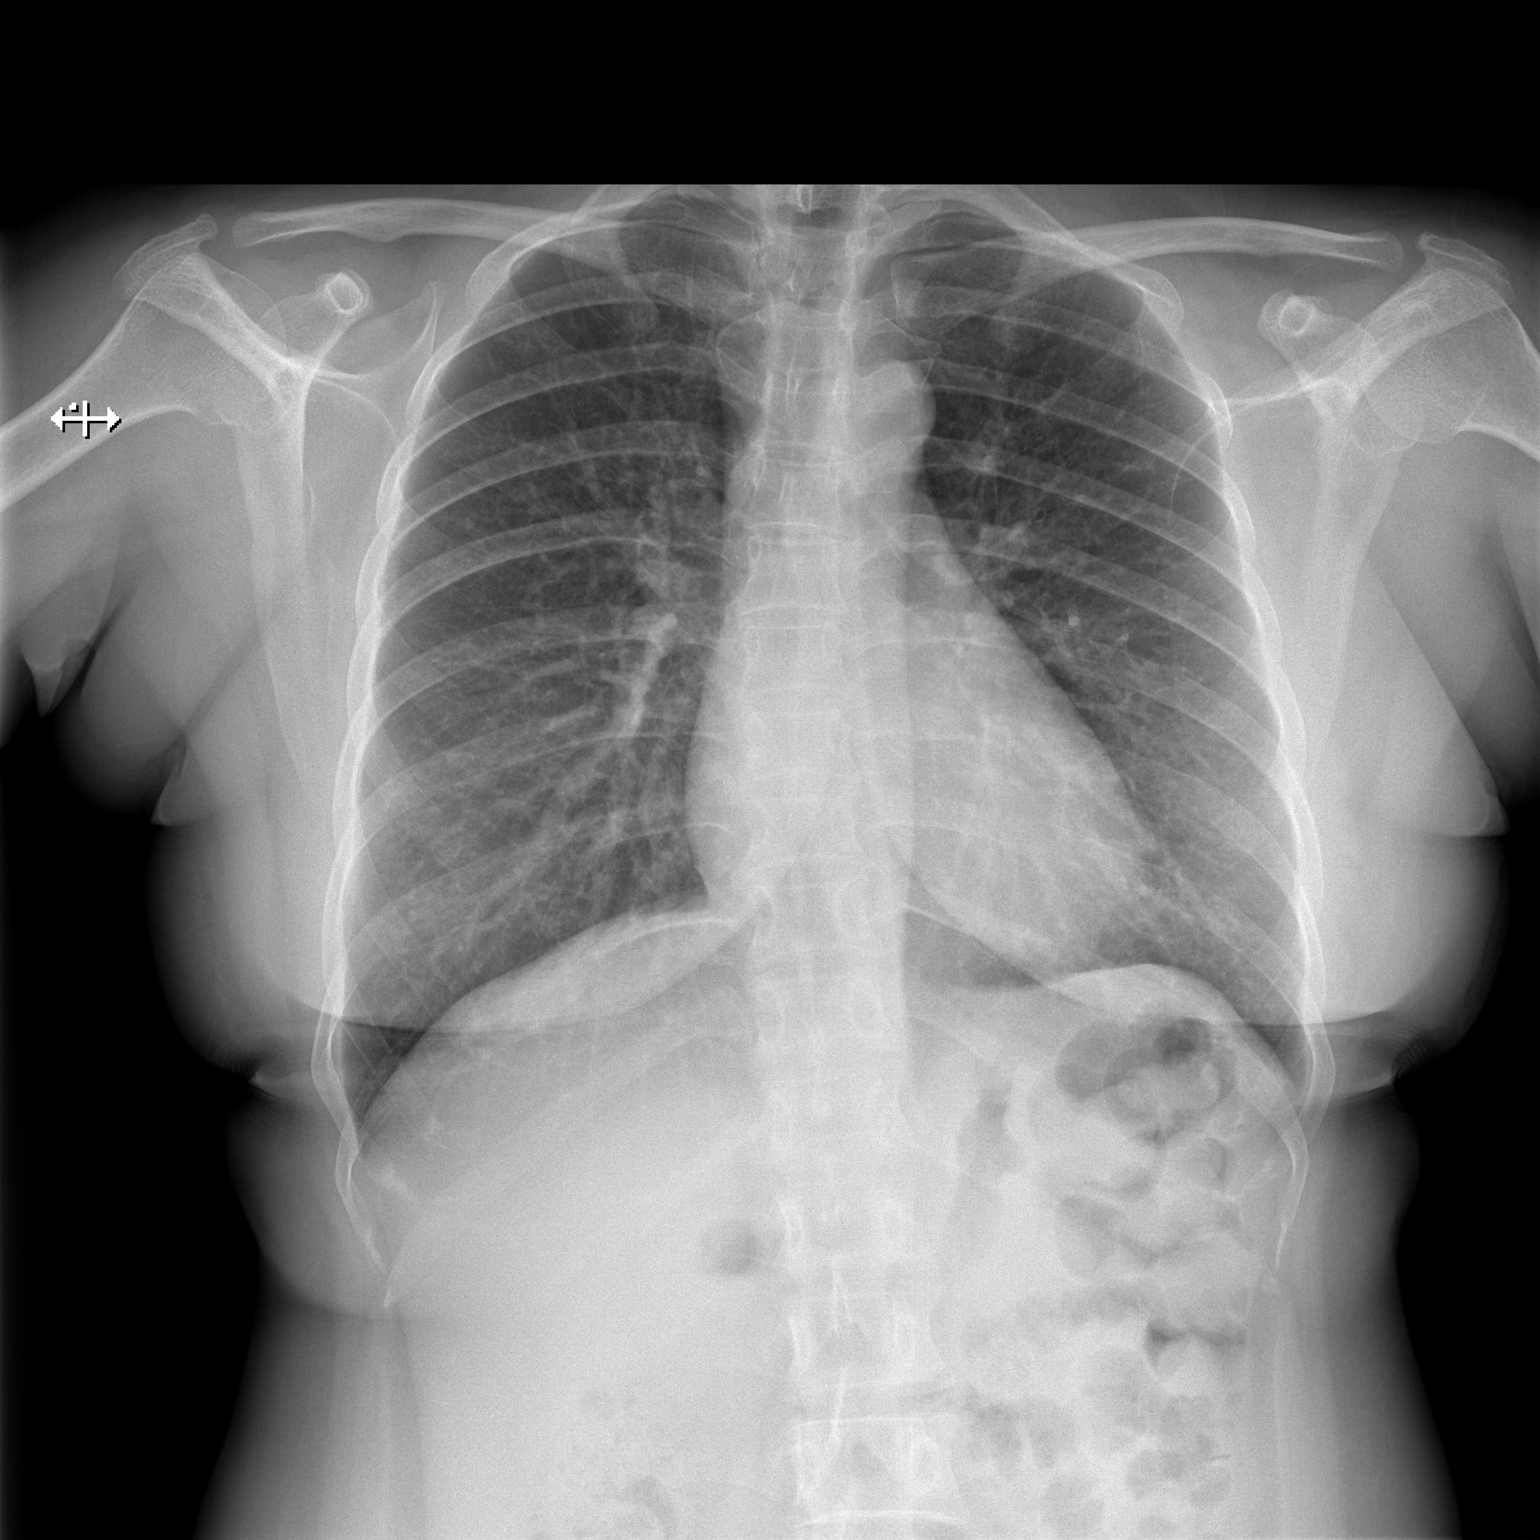

[w ribs ap upper right]
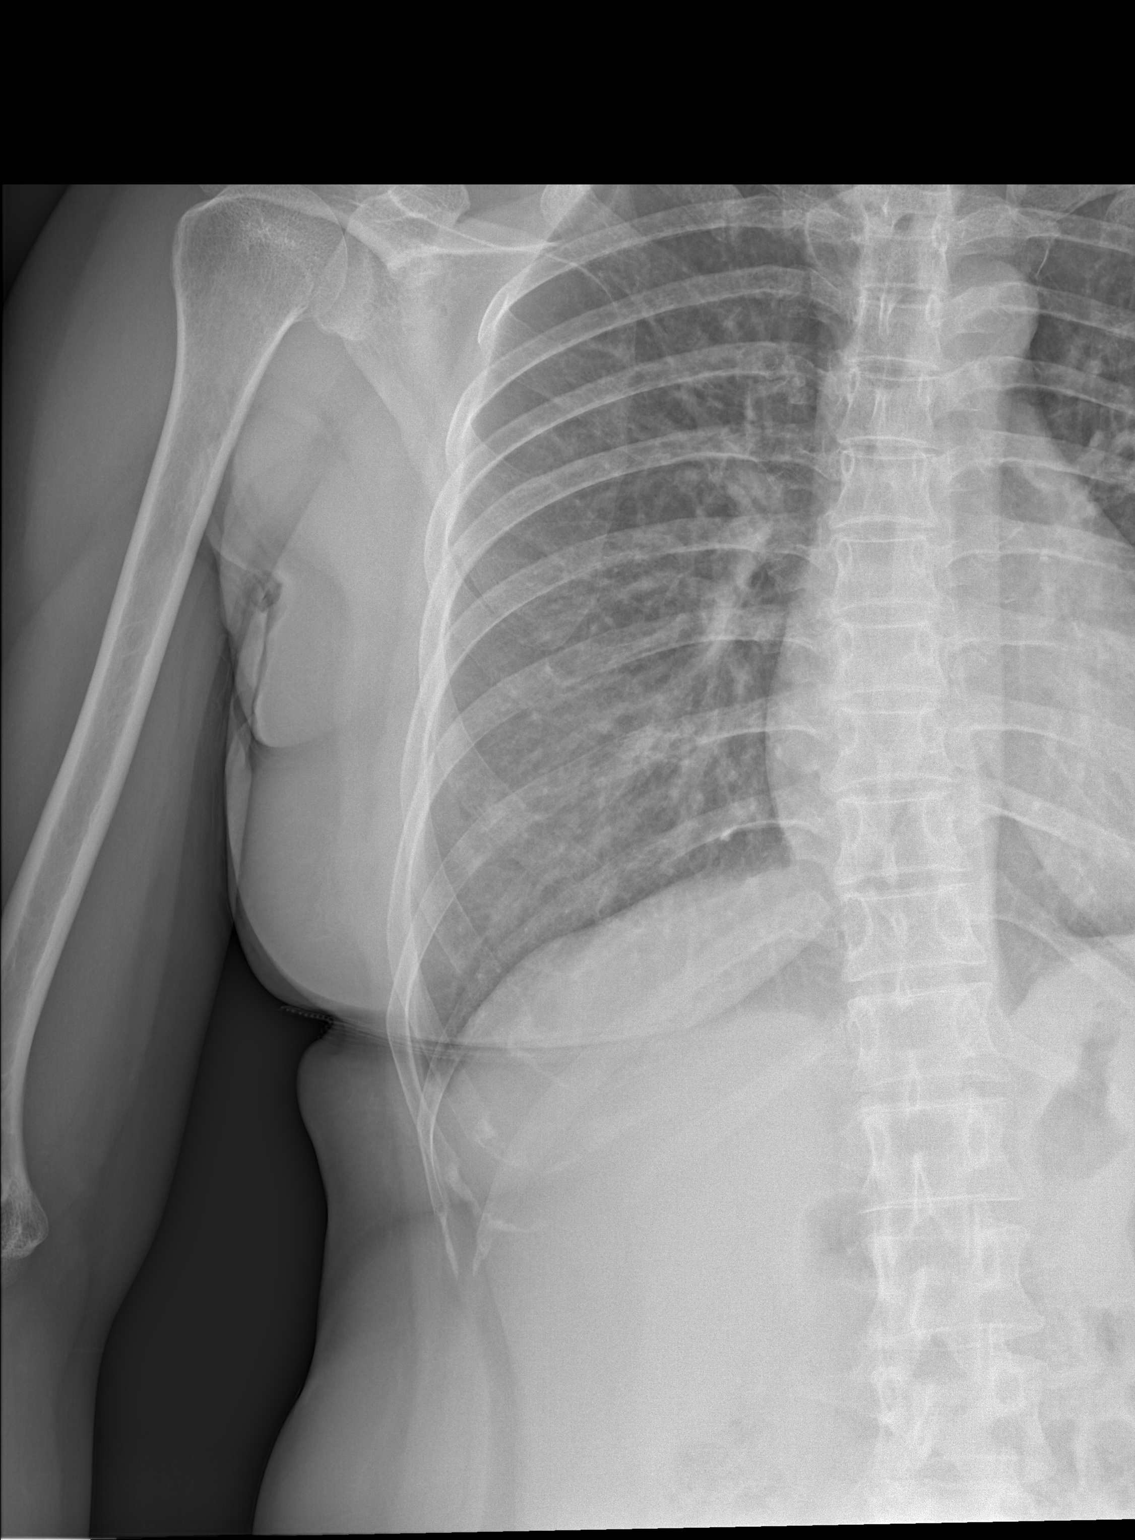

[w ribs obl right]
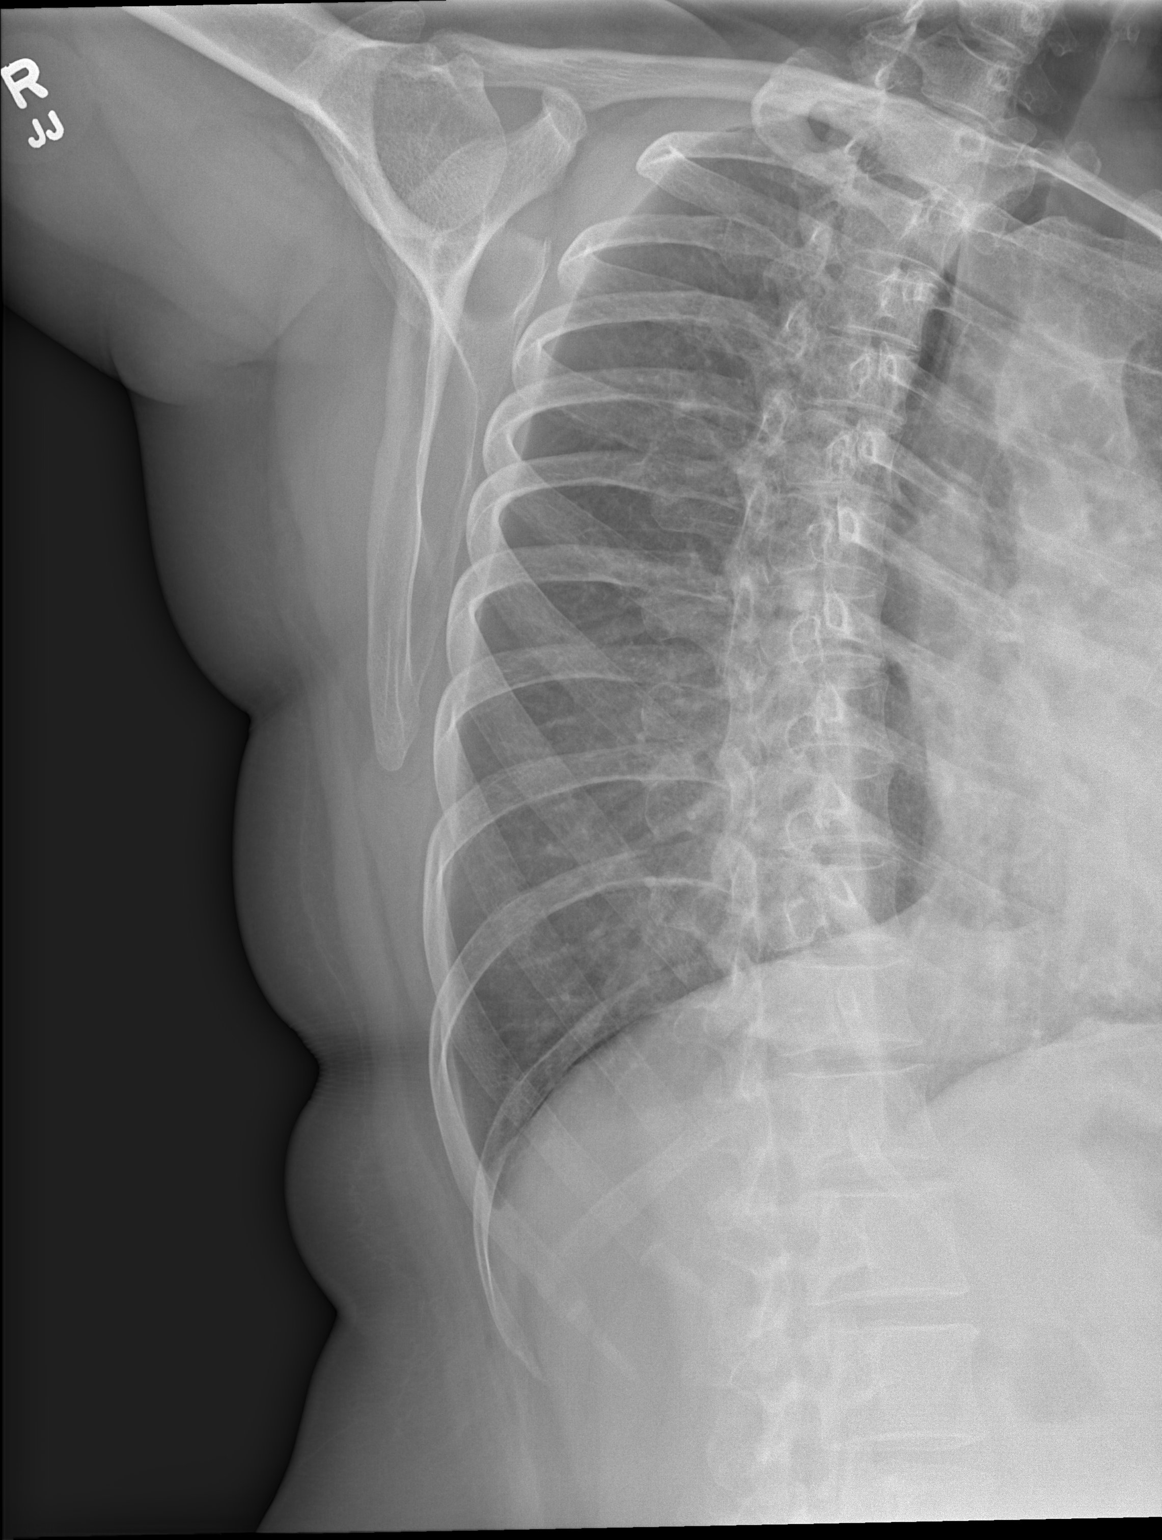

[3 of 3 positions shown; findings below may reference images not displayed]

FINDINGS: The lungs are clear. There is no pleural effusion pneumothorax. The
cardiac silhouette is within limits. No acute osseous pathology. No
displaced rib fractures.
IMPRESSION: Negative.
